# Patient Record
Sex: Male | Born: 1955 | Race: Black or African American | Hispanic: No | Marital: Married | Smoking: Never smoker
Health system: Southern US, Community
[De-identification: ages and names within clinical notes are randomized; demographics above are authoritative.]

## PROBLEM LIST (undated history)

## (undated) DIAGNOSIS — E119 Type 2 diabetes mellitus without complications: Secondary | ICD-10-CM

## (undated) DIAGNOSIS — I10 Essential (primary) hypertension: Secondary | ICD-10-CM

## (undated) HISTORY — PX: HERNIA REPAIR: SHX51

---

## 2008-09-27 ENCOUNTER — Emergency Department (HOSPITAL_COMMUNITY): Admission: EM | Admit: 2008-09-27 | Discharge: 2008-09-27 | Payer: Self-pay | Admitting: Emergency Medicine

## 2010-11-03 IMAGING — CR DG CHEST 2V
2 series · 2 of 2 positions shown · non-contrast
Comparison: None

CLINICAL DATA: Cough

CHEST - 2 VIEW

[view not recorded (1 of 2)]
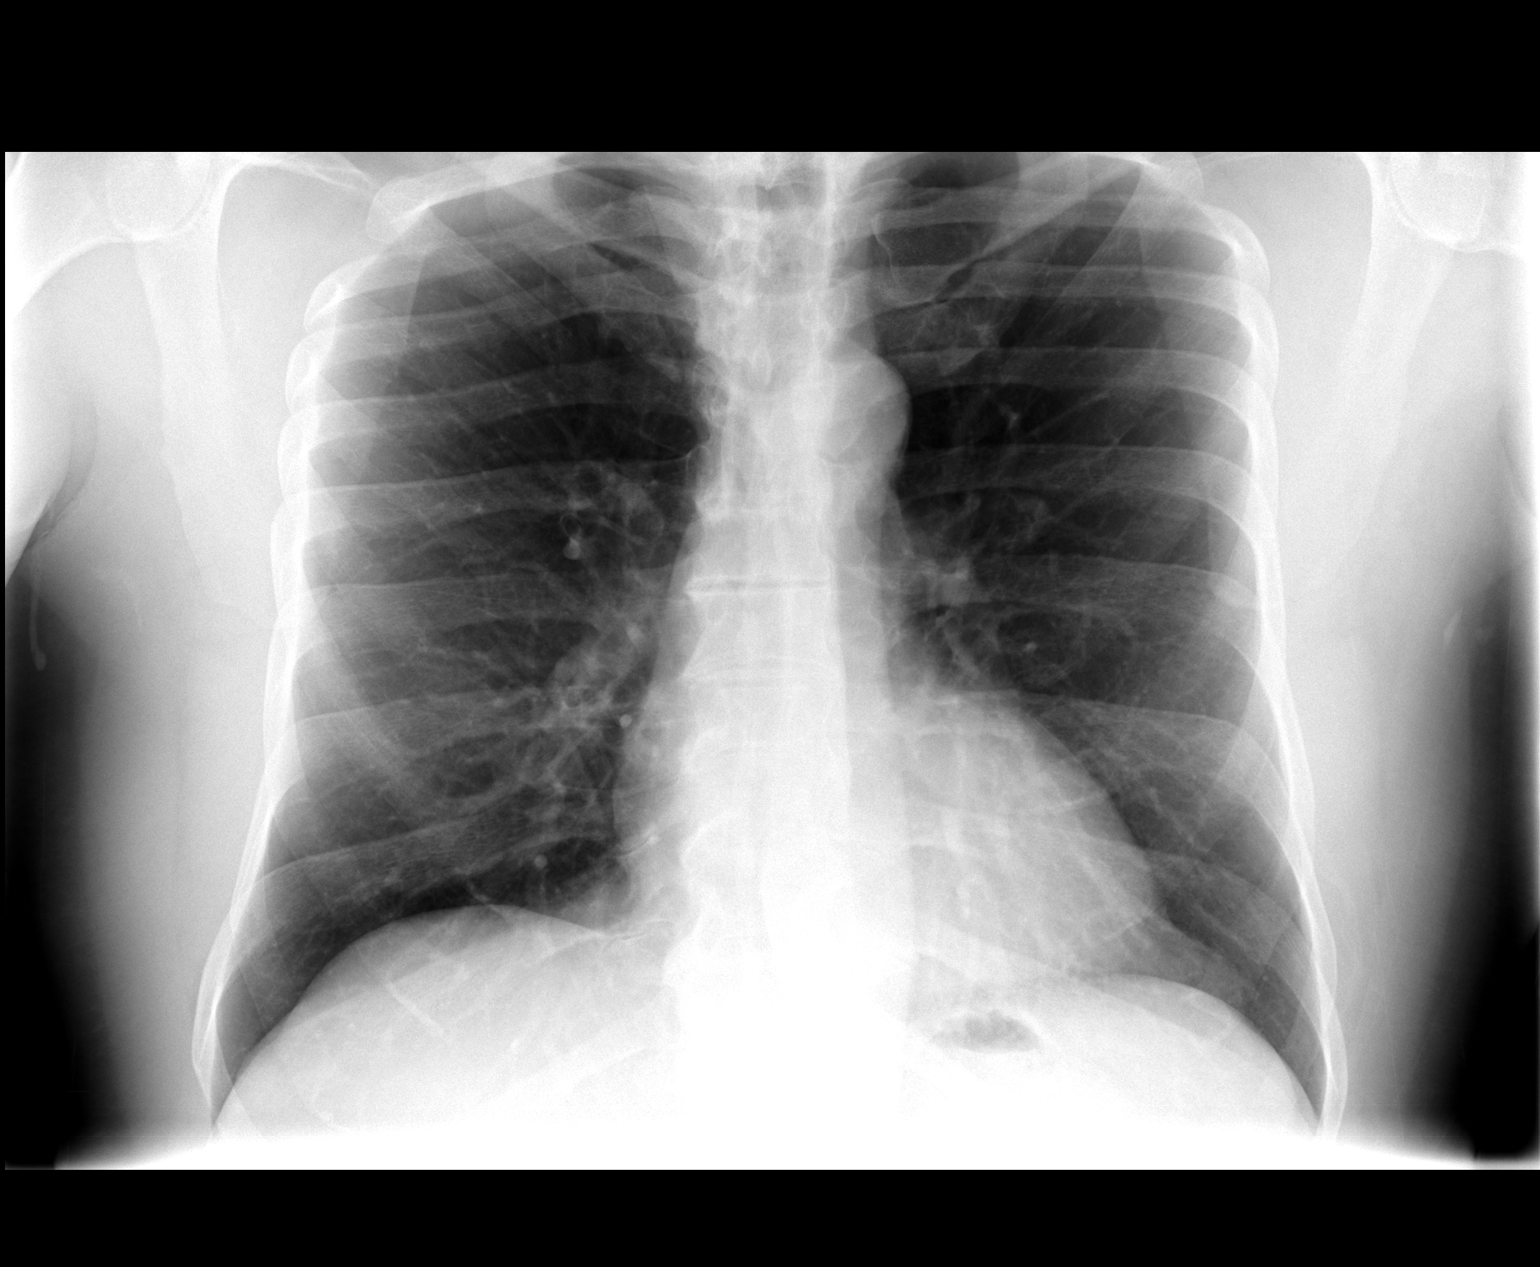

[view not recorded (2 of 2)]
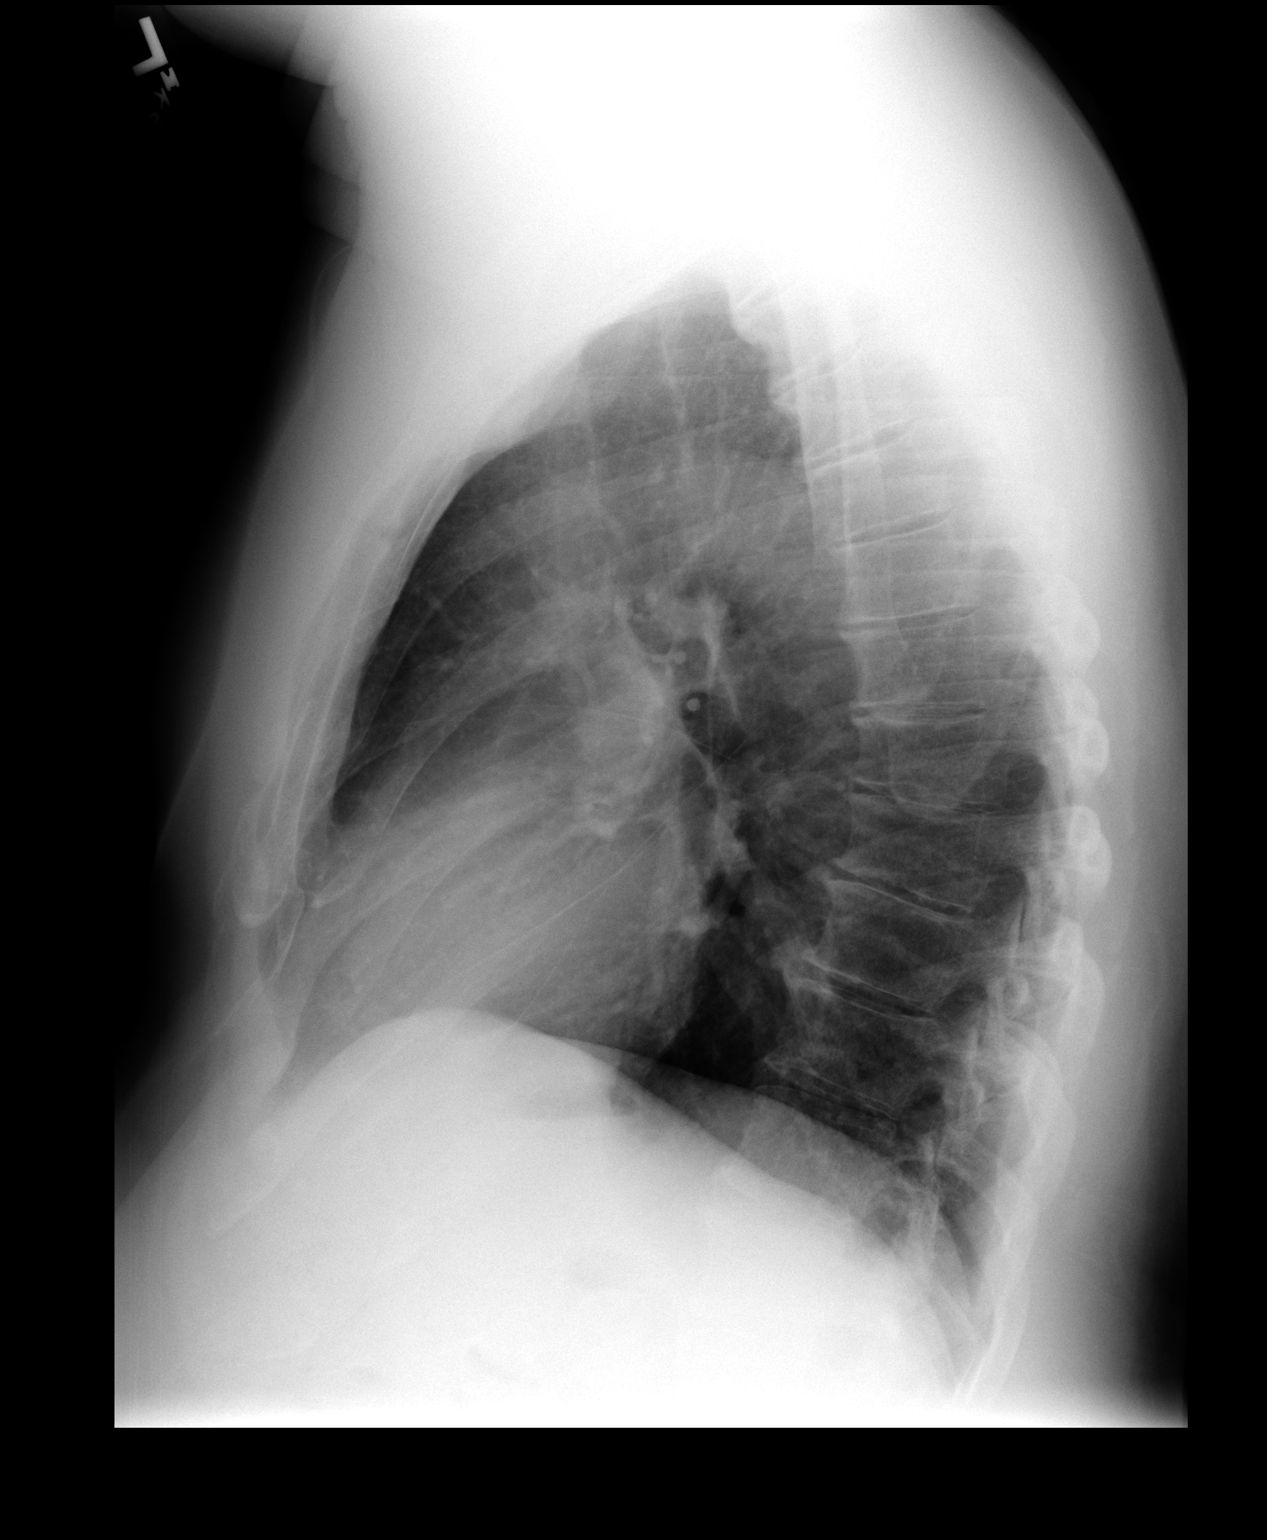

[2 of 2 positions shown; findings below may reference images not displayed]

FINDINGS: The lungs are clear.  Heart and mediastinal contours are
normal. Mid thoracic spine degenerative changes.
IMPRESSION: No acute cardiopulmonary process.

## 2017-09-03 ENCOUNTER — Emergency Department
Admission: EM | Admit: 2017-09-03 | Discharge: 2017-09-03 | Disposition: A | Discharge status: Home / Self Care | Payer: Self-pay | Attending: Emergency Medicine | Admitting: Emergency Medicine

## 2017-09-03 ENCOUNTER — Emergency Department: Payer: Self-pay

## 2017-09-03 ENCOUNTER — Encounter: Payer: Self-pay | Admitting: Emergency Medicine

## 2017-09-03 DIAGNOSIS — J181 Lobar pneumonia, unspecified organism: Secondary | ICD-10-CM | POA: Insufficient documentation

## 2017-09-03 DIAGNOSIS — R070 Pain in throat: Secondary | ICD-10-CM | POA: Insufficient documentation

## 2017-09-03 DIAGNOSIS — J189 Pneumonia, unspecified organism: Secondary | ICD-10-CM

## 2017-09-03 LAB — GROUP A STREP BY PCR: Group A Strep by PCR: NOT DETECTED

## 2017-09-03 MED ORDER — ALBUTEROL SULFATE HFA 108 (90 BASE) MCG/ACT IN AERS: 2.0000 | INHALATION_SPRAY | RESPIRATORY_TRACT | 1 refills | Status: DC | PRN

## 2017-09-03 MED ORDER — PREDNISONE 20 MG PO TABS
60.0000 mg | ORAL_TABLET | Freq: Once | ORAL | Status: AC
  Administered 2017-09-03: 60 mg via ORAL
  Filled 2017-09-03: qty 3

## 2017-09-03 MED ORDER — AZITHROMYCIN 250 MG PO TABS: ORAL_TABLET | ORAL | 0 refills | Status: DC

## 2017-09-03 MED ORDER — AZITHROMYCIN 500 MG PO TABS
500.0000 mg | ORAL_TABLET | Freq: Once | ORAL | Status: AC
  Administered 2017-09-03: 500 mg via ORAL
  Filled 2017-09-03: qty 1

## 2017-09-03 MED ORDER — PREDNISONE 10 MG PO TABS: 50.0000 mg | ORAL_TABLET | Freq: Every day | ORAL | 0 refills | Status: DC

## 2017-09-03 MED ORDER — GUAIFENESIN-CODEINE 100-10 MG/5ML PO SOLN: 10.0000 mL | Freq: Three times a day (TID) | ORAL | 0 refills | Status: DC | PRN

## 2017-09-03 NOTE — ED Provider Notes (Signed)
[facility] Emergency Department Provider Note  ____________________________________________  Time seen: Approximately 8:01 PM  I have reviewed the triage vital signs and the nursing notes.   HISTORY  Chief Complaint Cough and Sore Throat   HPI Roberto Cole is a 62 y.o. male who presents to the emergency department for treatment and evaluation of cough and sore throat for the past couple of weeks.  He has taken Zyrtec with no relief.  He denies history of COPD or asthma.  He denies fever, but admits to chills. No vomiting or diarrhea.  History reviewed. No pertinent past medical history.  There are no active problems to display for this patient.   Past Surgical History:  Procedure Laterality Date  . HERNIA REPAIR      Prior to Admission medications   Medication Sig Start Date End Date Taking? Authorizing Provider  albuterol (PROVENTIL HFA;VENTOLIN HFA) 108 (90 Base) MCG/ACT inhaler Inhale 2 puffs into the lungs every 4 (four) hours as needed for wheezing or shortness of breath. 09/03/17   Desia Saban B, FNP  azithromycin (ZITHROMAX) 250 MG tablet 2 tablets today, then 1 tablet for the next 4 days. 09/03/17   Kordelia Severin B, FNP  guaiFENesin-codeine 100-10 MG/5ML syrup Take 10 mLs by mouth 3 (three) times daily as needed. 09/03/17   Dona Walby B, FNP  predniSONE (DELTASONE) 10 MG tablet Take 5 tablets (50 mg total) by mouth daily. 09/03/17   Chinita Pester, FNP    Allergies Patient has no known allergies.  No family history on file.  Social History Social History   Tobacco Use  . Smoking status: Never Smoker  . Smokeless tobacco: Never Used  Substance Use Topics  . Alcohol use: Never    Frequency: Never  . Drug use: Never    Review of Systems Constitutional: Positive for chills ENT: Positive for sore throat. Cardiovascular: Denies chest pain. Respiratory: Negative for shortness of breath.  Positive for cough. Gastrointestinal:  Negative for nausea, no vomiting.  No diarrhea.  Musculoskeletal: Negative for body aches Skin: Negative for rash. Neurological: Negative for headaches ____________________________________________   PHYSICAL EXAM:  VITAL SIGNS: ED Triage Vitals [09/03/17 1928]  Enc Vitals Group     BP (!) 128/94     Pulse Rate 99     Resp 18     Temp 98.5 F (36.9 C)     Temp Source Oral     SpO2 94 %     Weight      Height      Head Circumference      Peak Flow      Pain Score 9     Pain Loc      Pain Edu?      Excl. in GC?     Constitutional: Alert and oriented.  Well appearing and in no acute distress. Eyes: Conjunctivae are normal. EOMI. Ears: Bilateral tympanic membranes are normal Nose: No sinus congestion noted; no rhinnorhea. Mouth/Throat: Mucous membranes are moist.  Oropharynx mildly erythematous. Tonsils not visualized. Neck: No stridor.  Lymphatic: No cervical lymphadenopathy. Cardiovascular: Normal rate, regular rhythm. Good peripheral circulation. Respiratory: Normal respiratory effort.  No retractions.  Breath sounds clear to auscultation throughout. Gastrointestinal: Soft and nontender.  Musculoskeletal: FROM x 4 extremities.  Neurologic:  Normal speech and language.  Skin:  Skin is warm, dry and intact. No rash noted. Psychiatric: Mood and affect are normal. Speech and behavior are normal.  ____________________________________________   LABS (all labs ordered  are listed, but only abnormal results are displayed)  Labs Reviewed  GROUP A STREP BY PCR   ____________________________________________  EKG  Not indicated ____________________________________________  RADIOLOGY  Chest x-ray shows concern for newly diagnosed pulmonary nodules and CT is recommended by radiology.  CT of the chest without contrast shows small foci of bilateral upper lobe pneumonia that correspondence to the opacity/pulmonary nodules on the plain chest  film. ____________________________________________   PROCEDURES  Procedure(s) performed: None  Critical Care performed: No ____________________________________________   INITIAL IMPRESSION / ASSESSMENT AND PLAN / ED COURSE  62 y.o. male who presents to the emergency department for treatment and evaluation of sore throat and cough for the past 2 weeks.  Patient states that he has taken Zyrtec without any relief at all.  He states that the symptoms are worsening.  Chest x-ray has been requested to confirm that there is no pneumonia.  After CT, patient was started on azithromycin and given prescription for the same.  He will also be given prednisone, albuterol, and guaifenesin with codeine.  He was advised to see his primary care doctor in 1 week for recheck.  He was encouraged to have a follow-up chest x-ray in 4 to 6 weeks to make sure that the pneumonia has cleared and that there is no underlying malignancies.  He was encouraged to return to the ER for symptoms that change or worsen if unable to schedule an appointment.  Medications  azithromycin (ZITHROMAX) tablet 500 mg (500 mg Oral Given 09/03/17 2234)  predniSONE (DELTASONE) tablet 60 mg (60 mg Oral Given 09/03/17 2234)    ED Discharge Orders        Ordered    azithromycin (ZITHROMAX) 250 MG tablet     09/03/17 2244    predniSONE (DELTASONE) 10 MG tablet  Daily     09/03/17 2244    guaiFENesin-codeine 100-10 MG/5ML syrup  3 times daily PRN     09/03/17 2244    albuterol (PROVENTIL HFA;VENTOLIN HFA) 108 (90 Base) MCG/ACT inhaler  Every 4 hours PRN     09/03/17 2244       Pertinent labs & imaging results that were available during my care of the patient were reviewed by me and considered in my medical decision making (see chart for details).    If controlled substance prescribed during this visit, 12 month history viewed on the [facility] prior to issuing an initial prescription for Schedule II or III  opiod. ____________________________________________   FINAL CLINICAL IMPRESSION(S) / ED DIAGNOSES  Final diagnoses:  Community acquired pneumonia of left upper lobe of lung (HCC)  Community acquired pneumonia of right upper lobe of lung (HCC)    Note:  This document was prepared using Conservation officer, historic buildings and may include unintentional dictation errors.     Chinita Pester, FNP 09/03/17 2321    Dionne Bucy, MD 09/03/17 2350

## 2017-09-03 NOTE — ED Triage Notes (Signed)
Pt states he has had a cough and sore throat for a couple of weeks and it taking Zyrtec with no relief.  He states he has yellow phlegm coming up and that he has been drinking lot of water.  Pt denies any COPD or Asthma hx.

## 2019-10-10 IMAGING — CT CT CHEST W/O CM
2 of 3 series · 15 of 36 positions shown, 18 images · non-contrast
Comparison: 09/03/2017 chest radiograph.

CLINICAL DATA: 62 y/o M; productive cough and sore throat for a
couple weeks.

EXAM:
CT CHEST WITHOUT CONTRAST
TECHNIQUE: Multidetector CT imaging of the chest was performed following the
standard protocol without IV contrast.

[Series 2: thorax · axial · 0.70mm/px · z∈[-546,-270]mm · 12 of 163 slices shown, 15 images]
[im 13/163  mediastinal]
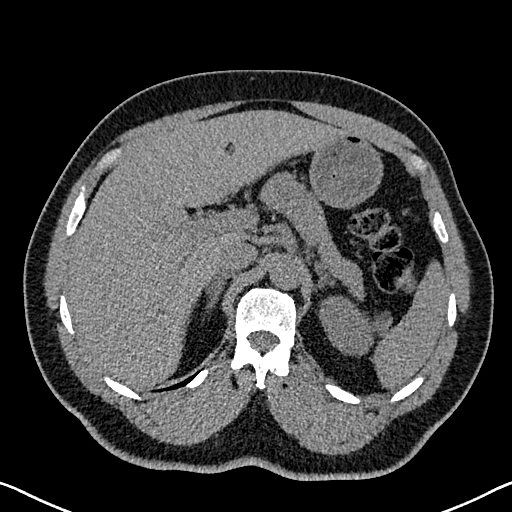
[im 13/163  lung]
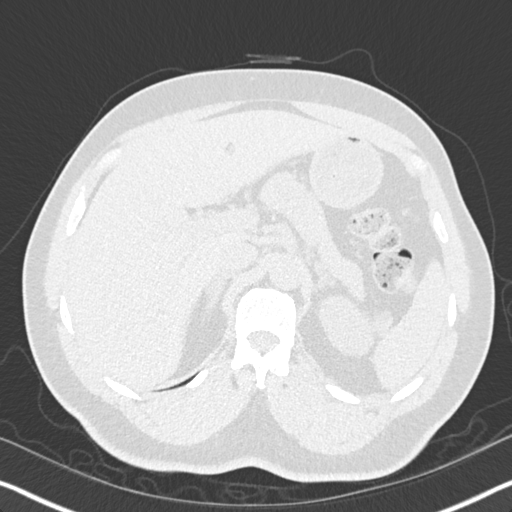
[im 25/163  lung]
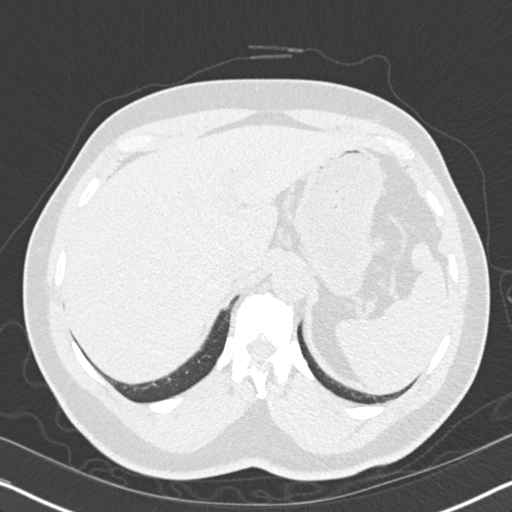
[im 37/163  lung]
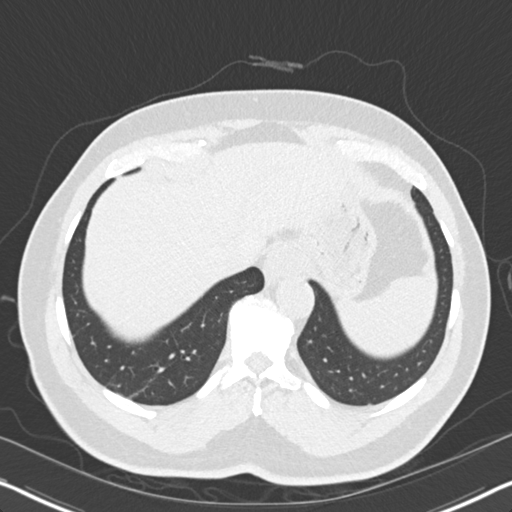
[im 49/163  lung]
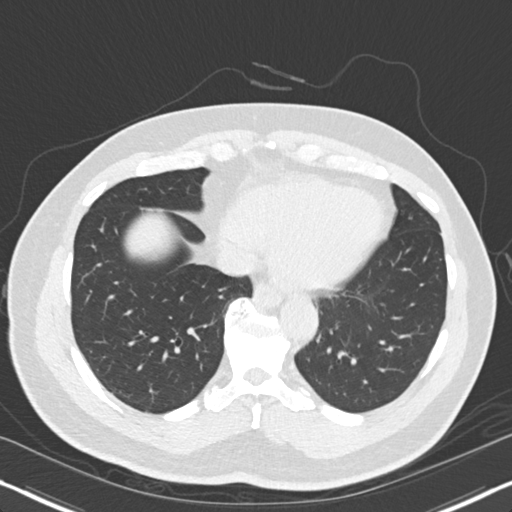
[im 61/163  mediastinal]
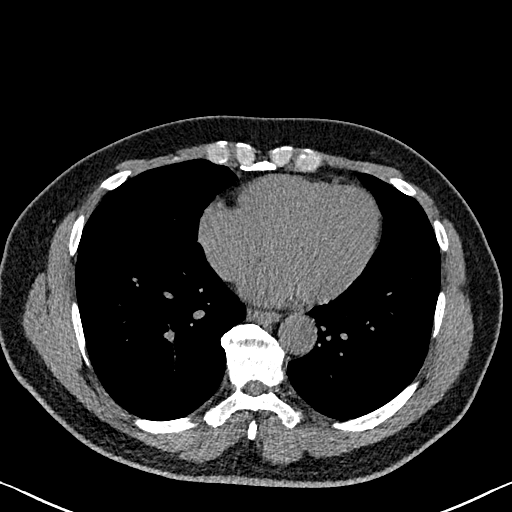
[im 61/163  lung]
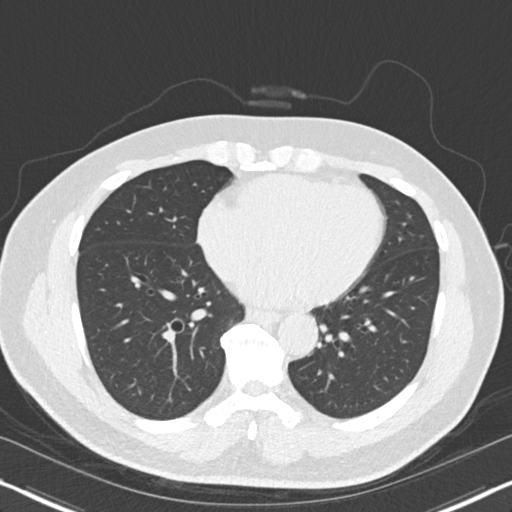
[im 73/163  lung]
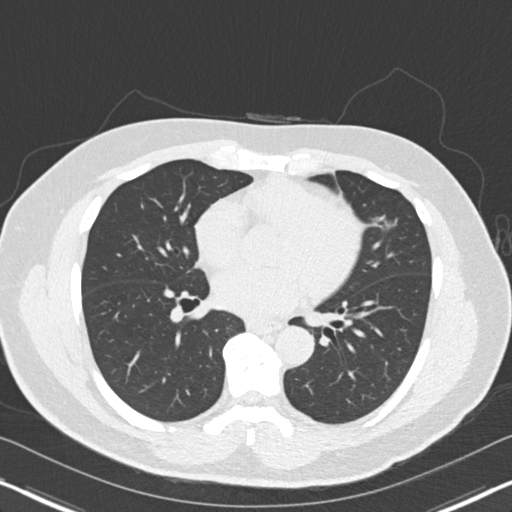
[im 91/163  lung]
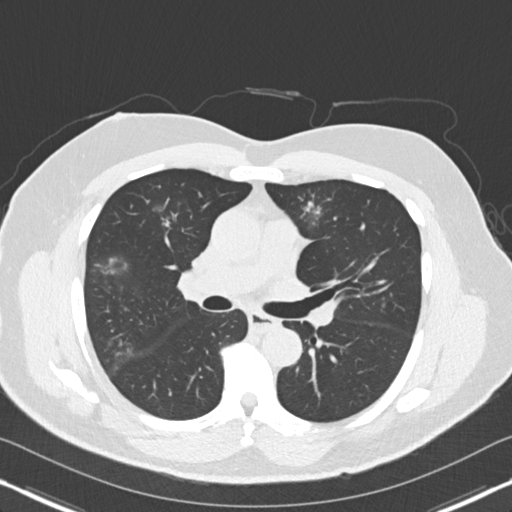
[im 103/163  lung]
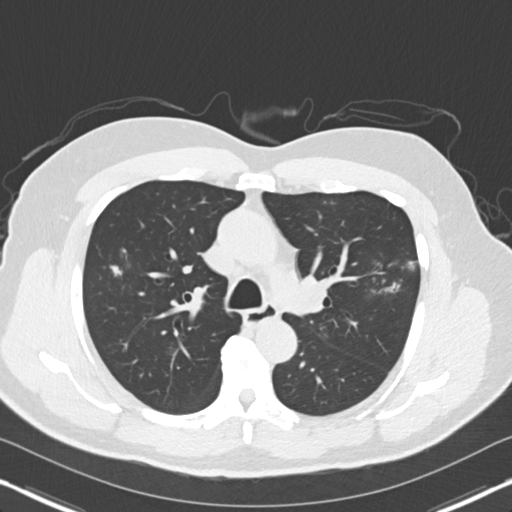
[im 115/163  mediastinal]
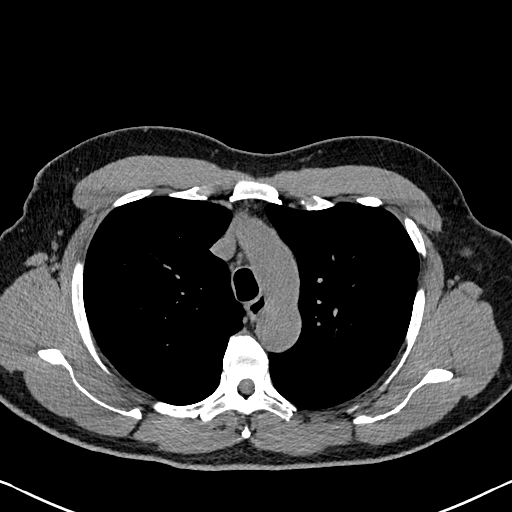
[im 115/163  lung]
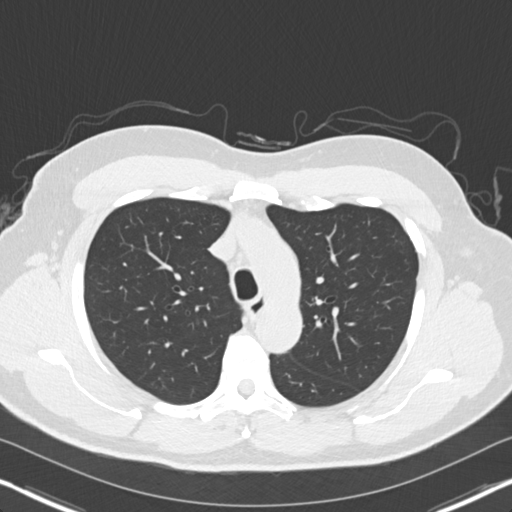
[im 127/163  lung]
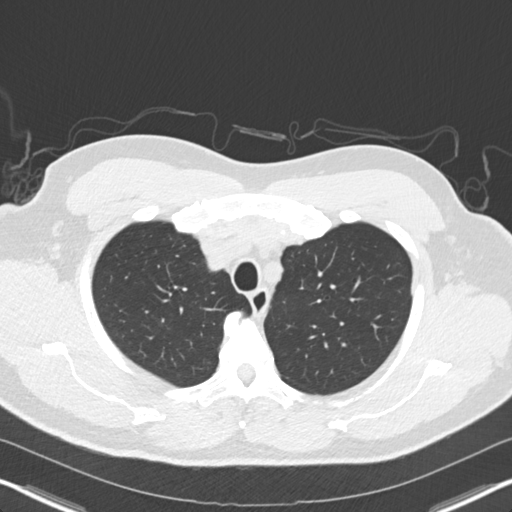
[im 139/163  lung]
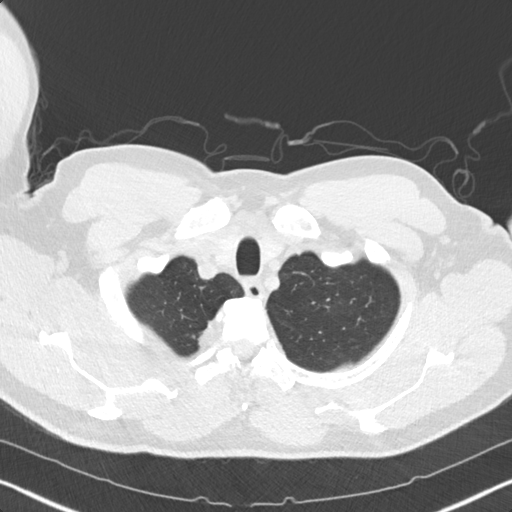
[im 151/163  lung]
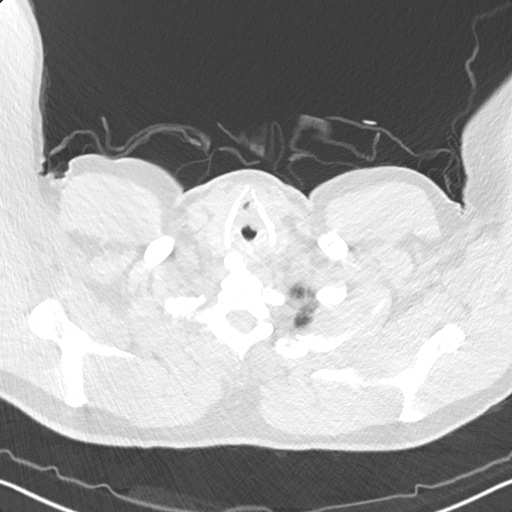

[Series 5: coronal · coronal · 0.68mm/px · 3 of 135 slices shown]
[im 27/135  lung]
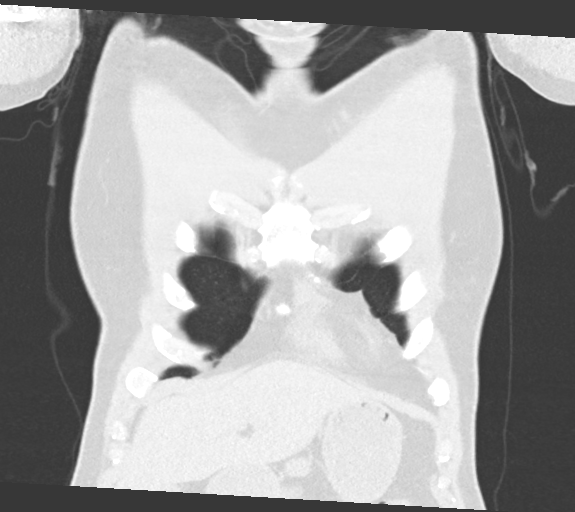
[im 54/135  lung]
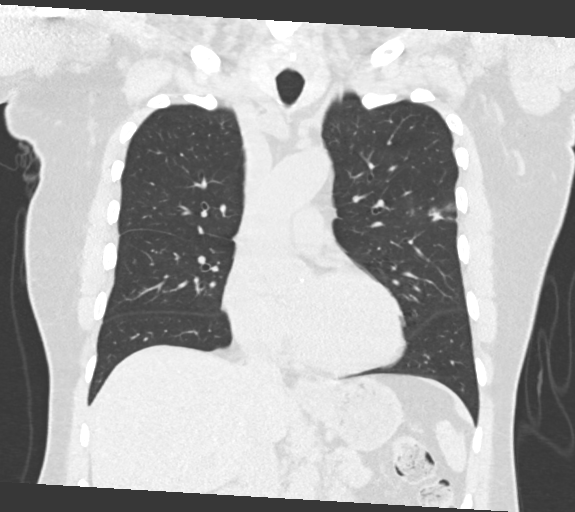
[im 81/135  lung]
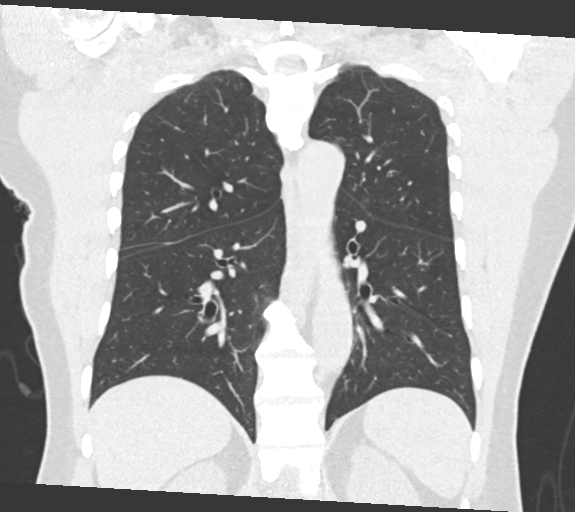

[15 of 36 positions shown; findings below may reference images not displayed]

FINDINGS: Cardiovascular: No significant vascular findings. Normal heart size.
No pericardial effusion.

Mediastinum/Nodes: No enlarged mediastinal or axillary lymph nodes.
Thyroid gland, trachea, and esophagus demonstrate no significant
findings.

Lungs/Pleura: Small patchy consolidations and clustered ground-glass
nodules in bronchovascular distribution within the upper lobes
bilaterally corresponding to nodular opacities on the chest
radiograph. No pleural effusion or pneumothorax.

Upper Abdomen: No acute abnormality.

Musculoskeletal: No chest wall mass or suspicious bone lesions
identified.
IMPRESSION: Small foci of bilateral upper lobe pneumonia corresponding to
opacities on chest radiograph. Followup PA and lateral chest X-ray
is recommended in 3-4 weeks following trial of antibiotic therapy to
ensure resolution and exclude underlying malignancy.

By: Lote Batalla M.D.

## 2024-05-26 ENCOUNTER — Emergency Department

## 2024-05-26 ENCOUNTER — Other Ambulatory Visit: Payer: Self-pay

## 2024-05-26 ENCOUNTER — Observation Stay
Admission: EM | Admit: 2024-05-26 | Discharge: 2024-05-28 | Disposition: A | Attending: Internal Medicine | Admitting: Internal Medicine

## 2024-05-26 DIAGNOSIS — N189 Chronic kidney disease, unspecified: Secondary | ICD-10-CM | POA: Insufficient documentation

## 2024-05-26 DIAGNOSIS — A046 Enteritis due to Yersinia enterocolitica: Principal | ICD-10-CM | POA: Insufficient documentation

## 2024-05-26 DIAGNOSIS — Z79899 Other long term (current) drug therapy: Secondary | ICD-10-CM | POA: Insufficient documentation

## 2024-05-26 DIAGNOSIS — E785 Hyperlipidemia, unspecified: Secondary | ICD-10-CM | POA: Insufficient documentation

## 2024-05-26 DIAGNOSIS — R6883 Chills (without fever): Secondary | ICD-10-CM

## 2024-05-26 DIAGNOSIS — E1122 Type 2 diabetes mellitus with diabetic chronic kidney disease: Secondary | ICD-10-CM | POA: Insufficient documentation

## 2024-05-26 DIAGNOSIS — I129 Hypertensive chronic kidney disease with stage 1 through stage 4 chronic kidney disease, or unspecified chronic kidney disease: Secondary | ICD-10-CM | POA: Insufficient documentation

## 2024-05-26 DIAGNOSIS — A058 Other specified bacterial foodborne intoxications: Secondary | ICD-10-CM

## 2024-05-26 DIAGNOSIS — E872 Acidosis, unspecified: Secondary | ICD-10-CM

## 2024-05-26 DIAGNOSIS — R109 Unspecified abdominal pain: Secondary | ICD-10-CM | POA: Diagnosis present

## 2024-05-26 DIAGNOSIS — I5A Non-ischemic myocardial injury (non-traumatic): Secondary | ICD-10-CM | POA: Insufficient documentation

## 2024-05-26 DIAGNOSIS — R531 Weakness: Secondary | ICD-10-CM

## 2024-05-26 DIAGNOSIS — I10 Essential (primary) hypertension: Secondary | ICD-10-CM | POA: Insufficient documentation

## 2024-05-26 DIAGNOSIS — K429 Umbilical hernia without obstruction or gangrene: Secondary | ICD-10-CM | POA: Insufficient documentation

## 2024-05-26 DIAGNOSIS — A419 Sepsis, unspecified organism: Principal | ICD-10-CM | POA: Insufficient documentation

## 2024-05-26 DIAGNOSIS — R7989 Other specified abnormal findings of blood chemistry: Secondary | ICD-10-CM | POA: Insufficient documentation

## 2024-05-26 DIAGNOSIS — K5730 Diverticulosis of large intestine without perforation or abscess without bleeding: Secondary | ICD-10-CM | POA: Insufficient documentation

## 2024-05-26 DIAGNOSIS — N2889 Other specified disorders of kidney and ureter: Secondary | ICD-10-CM | POA: Insufficient documentation

## 2024-05-26 HISTORY — DX: Essential (primary) hypertension: I10

## 2024-05-26 HISTORY — DX: Type 2 diabetes mellitus without complications: E11.9

## 2024-05-26 LAB — COMPREHENSIVE METABOLIC PANEL WITH GFR
ALT: 11 U/L (ref 0–44)
AST: 16 U/L (ref 15–41)
Albumin: 4.2 g/dL (ref 3.5–5.0)
Alkaline Phosphatase: 129 U/L — ABNORMAL HIGH (ref 38–126)
Anion gap: 15 (ref 5–15)
BUN: 21 mg/dL (ref 8–23)
CO2: 22 mmol/L (ref 22–32)
Calcium: 9.3 mg/dL (ref 8.9–10.3)
Chloride: 100 mmol/L (ref 98–111)
Creatinine, Ser: 1.87 mg/dL — ABNORMAL HIGH (ref 0.61–1.24)
GFR, Estimated: 39 mL/min — ABNORMAL LOW
Glucose, Bld: 173 mg/dL — ABNORMAL HIGH (ref 70–99)
Potassium: 4.9 mmol/L (ref 3.5–5.1)
Sodium: 137 mmol/L (ref 135–145)
Total Bilirubin: 1.7 mg/dL — ABNORMAL HIGH (ref 0.0–1.2)
Total Protein: 7.1 g/dL (ref 6.5–8.1)

## 2024-05-26 LAB — URINALYSIS, ROUTINE W REFLEX MICROSCOPIC
Bilirubin Urine: NEGATIVE
Glucose, UA: NEGATIVE mg/dL
Hgb urine dipstick: NEGATIVE
Ketones, ur: NEGATIVE mg/dL
Leukocytes,Ua: NEGATIVE
Nitrite: NEGATIVE
Protein, ur: NEGATIVE mg/dL
Specific Gravity, Urine: 1.008 (ref 1.005–1.030)
pH: 5 (ref 5.0–8.0)

## 2024-05-26 LAB — CBC
HCT: 36.3 % — ABNORMAL LOW (ref 39.0–52.0)
Hemoglobin: 12.3 g/dL — ABNORMAL LOW (ref 13.0–17.0)
MCH: 28.6 pg (ref 26.0–34.0)
MCHC: 33.9 g/dL (ref 30.0–36.0)
MCV: 84.4 fL (ref 80.0–100.0)
Platelets: 213 10*3/uL (ref 150–400)
RBC: 4.3 MIL/uL (ref 4.22–5.81)
RDW: 13.2 % (ref 11.5–15.5)
WBC: 17.2 10*3/uL — ABNORMAL HIGH (ref 4.0–10.5)
nRBC: 0 % (ref 0.0–0.2)

## 2024-05-26 LAB — LACTIC ACID, PLASMA: Lactic Acid, Venous: 2.4 mmol/L (ref 0.5–1.9)

## 2024-05-26 LAB — RESP PANEL BY RT-PCR (RSV, FLU A&B, COVID)  RVPGX2
Influenza A by PCR: NEGATIVE
Influenza B by PCR: NEGATIVE
Resp Syncytial Virus by PCR: NEGATIVE
SARS Coronavirus 2 by RT PCR: NEGATIVE

## 2024-05-26 LAB — PROCALCITONIN: Procalcitonin: 0.92 ng/mL

## 2024-05-26 LAB — TROPONIN T, HIGH SENSITIVITY
Troponin T High Sensitivity: 24 ng/L — ABNORMAL HIGH (ref 0–19)
Troponin T High Sensitivity: 22 ng/L — ABNORMAL HIGH (ref 0–19)

## 2024-05-26 LAB — CBG MONITORING, ED: Glucose-Capillary: 153 mg/dL — ABNORMAL HIGH (ref 70–99)

## 2024-05-26 MED ORDER — VANCOMYCIN HCL 2000 MG/400ML IV SOLN
2000.0000 mg | Freq: Once | INTRAVENOUS | Status: AC
  Administered 2024-05-27: 2000 mg via INTRAVENOUS
  Filled 2024-05-26: qty 400

## 2024-05-26 MED ORDER — IOHEXOL 350 MG/ML SOLN
75.0000 mL | Freq: Once | INTRAVENOUS | Status: AC | PRN
  Administered 2024-05-26: 75 mL via INTRAVENOUS

## 2024-05-26 MED ORDER — ONDANSETRON HCL 4 MG/2ML IJ SOLN
4.0000 mg | Freq: Once | INTRAMUSCULAR | Status: AC
  Administered 2024-05-26: 4 mg via INTRAVENOUS
  Filled 2024-05-26: qty 2

## 2024-05-26 MED ORDER — LACTATED RINGERS IV BOLUS
1000.0000 mL | Freq: Once | INTRAVENOUS | Status: AC
  Administered 2024-05-26: 1000 mL via INTRAVENOUS

## 2024-05-26 MED ORDER — PIPERACILLIN-TAZOBACTAM 3.375 G IVPB 30 MIN
3.3750 g | Freq: Once | INTRAVENOUS | Status: AC
  Administered 2024-05-26: 3.375 g via INTRAVENOUS
  Filled 2024-05-26: qty 50

## 2024-05-26 NOTE — ED Triage Notes (Signed)
 Pt to ED for nausea and weakness. Pt reports nausea began yesterday along with weakness. Wife reports pt has been confused and fatigued starting today. Pt reports feeling weak in both legs. Denies Cp, SOB, Abdominal pain. Pt has hx of diabetes. Pt states he does not check glucose daily.

## 2024-05-26 NOTE — ED Provider Notes (Signed)
 "  [facility] Provider Note    Event Date/Time   First MD Initiated Contact with Patient 05/26/24 1944     (approximate)   History   Nausea and Weakness   HPI  Roberto Cole is a 69 y.o. male who presents to the ED for evaluation of Nausea and Weakness   I reviewed a neurology clinic visit from 2023, nothing more recent.  History of TIA, HTN, HLD, DM  Patient presents with 1 day of nausea and weakness without emesis or pain.  Reports feeling confused and acknowledging this and has no other symptoms.  No stool or urinary changes.  Known right inguinal hernia and denies any pain to this area.  Reports a normal bowel movement this morning   Physical Exam   Triage Vital Signs: ED Triage Vitals  Encounter Vitals Group     BP 05/26/24 1722 107/65     Girls Systolic BP Percentile --      Girls Diastolic BP Percentile --      Boys Systolic BP Percentile --      Boys Diastolic BP Percentile --      Pulse Rate 05/26/24 1722 83     Resp 05/26/24 1722 18     Temp 05/26/24 1722 99.6 F (37.6 C)     Temp Source 05/26/24 1722 Oral     SpO2 05/26/24 1722 95 %     Weight 05/26/24 1722 180 lb (81.6 kg)     Height 05/26/24 1722 5' 9 (1.753 m)     Head Circumference --      Peak Flow --      Pain Score 05/26/24 1730 0     Pain Loc --      Pain Education --      Exclude from Growth Chart --     Most recent vital signs: Vitals:   05/26/24 1731 05/26/24 2033  BP:  123/74  Pulse:  77  Resp: (!) 24 (!) 26  Temp:  98.3 F (36.8 C)  SpO2:  98%    General: Awake, no distress.  CV:  Good peripheral perfusion.  Resp:  Normal effort.  Abd:  No distention.  Soft right inguinal hernia MSK:  No deformity noted.  Neuro:  No focal deficits appreciated. Other:     ED Results / Procedures / Treatments   Labs (all labs ordered are listed, but only abnormal results are displayed) Labs Reviewed  COMPREHENSIVE METABOLIC PANEL WITH GFR - Abnormal; Notable for  the following components:      Result Value   Glucose, Bld 173 (*)    Creatinine, Ser 1.87 (*)    Alkaline Phosphatase 129 (*)    Total Bilirubin 1.7 (*)    GFR, Estimated 39 (*)    All other components within normal limits  CBC - Abnormal; Notable for the following components:   WBC 17.2 (*)    Hemoglobin 12.3 (*)    HCT 36.3 (*)    All other components within normal limits  LACTIC ACID, PLASMA - Abnormal; Notable for the following components:   Lactic Acid, Venous 2.4 (*)    All other components within normal limits  CBG MONITORING, ED - Abnormal; Notable for the following components:   Glucose-Capillary 153 (*)    All other components within normal limits  TROPONIN T, HIGH SENSITIVITY - Abnormal; Notable for the following components:   Troponin T High Sensitivity 24 (*)    All other components within normal limits  TROPONIN T, HIGH SENSITIVITY - Abnormal; Notable for the following components:   Troponin T High Sensitivity 22 (*)    All other components within normal limits  RESP PANEL BY RT-PCR (RSV, FLU A&B, COVID)  RVPGX2  CULTURE, BLOOD (ROUTINE X 2)  CULTURE, BLOOD (ROUTINE X 2)  PROCALCITONIN  URINALYSIS, ROUTINE W REFLEX MICROSCOPIC  CBG MONITORING, ED    EKG Sinus rhythm the rate of 84 bpm, normal axis and intervals.  Inverted T waves to V5 and V6, nonspecific biphasic T waves to 2 and 3.  No STEMI.  No comparison EKG.  RADIOLOGY CXR interpreted by me without evidence of acute cardiopulmonary pathology. CT abdomen/pelvis interpreted by me with multiple intrarenal calculi clustered within the superior calyx.  No ureteral obstruction  Official radiology report(s): CT ABDOMEN PELVIS W CONTRAST Result Date: 05/26/2024 EXAM: CT ABDOMEN AND PELVIS WITH CONTRAST 05/26/2024 09:24:25 PM TECHNIQUE: CT of the abdomen and pelvis was performed with the administration of 75 mL of iohexol  (OMNIPAQUE ) 350 MG/ML injection. Multiplanar reformatted images are provided for review.  Automated exposure control, iterative reconstruction, and/or weight-based adjustment of the mA/kV was utilized to reduce the radiation dose to as low as reasonably achievable. COMPARISON: None available. CLINICAL HISTORY: Sepsis, nausea, lower abdominal tenderness. FINDINGS: LOWER CHEST: There is atelectasis in the lung bases. LIVER: The liver is unremarkable. GALLBLADDER AND BILE DUCTS: Gallbladder is unremarkable. No biliary ductal dilatation. SPLEEN: No acute abnormality. PANCREAS: No acute abnormality. ADRENAL GLANDS: No acute abnormality. KIDNEYS, URETERS AND BLADDER: Right kidney: Multiple right renal calculi are present, the majority of which are clustered together in the central kidney. These all measure 4 mm. These likely cause obstruction of the superior pole calyx. There is a cyst in the central pole of the right kidney measuring 2.7 cm. Per consensus, no follow-up is needed for simple Bosniak type 1 and 2 renal cysts, unless the patient has a malignancy history or risk factors. Left kidney: Left kidney appears normal. General: No hydronephrosis. No perinephric or periureteral stranding. Bladder: Urinary bladder is unremarkable. Prostate: Prostate gland is mildly enlarged. GI AND BOWEL: There is a small hiatal hernia. Sigmoid and descending colon diverticulosis. The appendix appears normal. There is no bowel obstruction. PERITONEUM AND RETROPERITONEUM: There is a small amount of free fluid in the pelvis. No free air. VASCULATURE: Aorta is normal in caliber. LYMPH NODES: No lymphadenopathy. REPRODUCTIVE ORGANS: Prostate gland is mildly enlarged. BONES AND SOFT TISSUES: Moderate left and large right inguinal hernias are present containing nondilated small bowel. There is a small fat containing umbilical hernia. No acute osseous abnormality. IMPRESSION: 1. Multiple right renal calculi measuring up to 4 mm, clustered in the central kidney, likely obstructing the superior pole calyx. 2. Right renal cyst  measuring 2.7 cm, without follow-up imaging recommended. 3. Moderate left and large right inguinal hernias containing nondilated small bowel. 4. Sigmoid and descending colon diverticulosis without diverticulitis. 5. Small volume free fluid in the pelvis. Electronically signed by: Greig Pique MD 05/26/2024 10:51 PM EST RP Workstation: [id number]   DG Chest Portable 1 View Result Date: 05/26/2024 CLINICAL DATA:  Sepsis, nausea, weakness EXAM: PORTABLE CHEST 1 VIEW COMPARISON:  09/03/2017 FINDINGS: Single frontal view of the chest demonstrates an unremarkable cardiac silhouette. No acute airspace disease, effusion, or pneumothorax. No acute bony abnormalities. IMPRESSION: 1. No acute intrathoracic process. Electronically Signed   By: Ozell Daring M.D.   On: 05/26/2024 22:15    PROCEDURES and INTERVENTIONS:  .Critical Care  Performed by: Claudene Rover, MD  Authorized by: Claudene Rover, MD   Critical care provider statement:    Critical care time (minutes):  30   Critical care time was exclusive of:  Separately billable procedures and treating other patients   Critical care was necessary to treat or prevent imminent or life-threatening deterioration of the following conditions:  Sepsis   Critical care was time spent personally by me on the following activities:  Development of treatment plan with patient or surrogate, discussions with consultants, evaluation of patient's response to treatment, examination of patient, ordering and review of laboratory studies, ordering and review of radiographic studies, ordering and performing treatments and interventions, pulse oximetry, re-evaluation of patient's condition and review of old charts   Medications  piperacillin -tazobactam (ZOSYN ) IVPB 3.375 g (3.375 g Intravenous New Bag/Given 05/26/24 2330)  vancomycin  (VANCOREADY) IVPB 2000 mg/400 mL (has no administration in time range)  lactated ringers  bolus 1,000 mL (1,000 mLs Intravenous New Bag/Given 05/26/24  2024)  ondansetron  (ZOFRAN ) injection 4 mg (4 mg Intravenous Given 05/26/24 2025)  iohexol  (OMNIPAQUE ) 350 MG/ML injection 75 mL (75 mLs Intravenous Contrast Given 05/26/24 2114)     IMPRESSION / MDM / ASSESSMENT AND PLAN / ED COURSE  I reviewed the triage vital signs and the nursing notes.  Differential diagnosis includes, but is not limited to, viral syndrome, UTI, sepsis, dehydration or AKI, incarcerated hernia, SBO, UTI, pneumonia  {Patient presents with symptoms of an acute illness or injury that is potentially life-threatening.  Patient presents with 1 day of vague generalized weakness and nausea concerning for sepsis.  Borderline temperature, elevated respiratory rate and leukocytosis, lactic acid and procalcitonin.  Elevated renal function without comparison concerning for possible AKI.  CXR is clear and CT abdomen/pelvis without clear etiology, intrarenal stones seem to be obstructing a superior calyx on the right.  Awaiting UA at the time of signout to oncoming physician with anticipation of medical admission for urinary versus unknown source      FINAL CLINICAL IMPRESSION(S) / ED DIAGNOSES   Final diagnoses:  None     Rx / DC Orders   ED Discharge Orders     None        Note:  This document was prepared using Dragon voice recognition software and may include unintentional dictation errors.   Claudene Rover, MD 05/26/24 [id number]  "

## 2024-05-26 NOTE — ED Notes (Signed)
 Patient transported to and from CT.

## 2024-05-27 DIAGNOSIS — R1084 Generalized abdominal pain: Secondary | ICD-10-CM

## 2024-05-27 DIAGNOSIS — R6883 Chills (without fever): Secondary | ICD-10-CM

## 2024-05-27 DIAGNOSIS — R531 Weakness: Secondary | ICD-10-CM

## 2024-05-27 DIAGNOSIS — A419 Sepsis, unspecified organism: Secondary | ICD-10-CM

## 2024-05-27 DIAGNOSIS — E1122 Type 2 diabetes mellitus with diabetic chronic kidney disease: Secondary | ICD-10-CM | POA: Insufficient documentation

## 2024-05-27 DIAGNOSIS — I5A Non-ischemic myocardial injury (non-traumatic): Secondary | ICD-10-CM | POA: Insufficient documentation

## 2024-05-27 DIAGNOSIS — E872 Acidosis, unspecified: Secondary | ICD-10-CM

## 2024-05-27 DIAGNOSIS — R109 Unspecified abdominal pain: Secondary | ICD-10-CM | POA: Diagnosis present

## 2024-05-27 DIAGNOSIS — N1831 Chronic kidney disease, stage 3a: Secondary | ICD-10-CM

## 2024-05-27 DIAGNOSIS — I10 Essential (primary) hypertension: Secondary | ICD-10-CM | POA: Insufficient documentation

## 2024-05-27 LAB — BASIC METABOLIC PANEL WITH GFR
Anion gap: 13 (ref 5–15)
BUN: 23 mg/dL (ref 8–23)
CO2: 24 mmol/L (ref 22–32)
Calcium: 8.5 mg/dL — ABNORMAL LOW (ref 8.9–10.3)
Chloride: 102 mmol/L (ref 98–111)
Creatinine, Ser: 1.72 mg/dL — ABNORMAL HIGH (ref 0.61–1.24)
GFR, Estimated: 43 mL/min — ABNORMAL LOW
Glucose, Bld: 118 mg/dL — ABNORMAL HIGH (ref 70–99)
Potassium: 4.1 mmol/L (ref 3.5–5.1)
Sodium: 139 mmol/L (ref 135–145)

## 2024-05-27 LAB — CBC
HCT: 33.3 % — ABNORMAL LOW (ref 39.0–52.0)
Hemoglobin: 11.3 g/dL — ABNORMAL LOW (ref 13.0–17.0)
MCH: 28.5 pg (ref 26.0–34.0)
MCHC: 33.9 g/dL (ref 30.0–36.0)
MCV: 83.9 fL (ref 80.0–100.0)
Platelets: 174 10*3/uL (ref 150–400)
RBC: 3.97 MIL/uL — ABNORMAL LOW (ref 4.22–5.81)
RDW: 13.2 % (ref 11.5–15.5)
WBC: 12.5 10*3/uL — ABNORMAL HIGH (ref 4.0–10.5)
nRBC: 0 % (ref 0.0–0.2)

## 2024-05-27 LAB — LACTIC ACID, PLASMA
Lactic Acid, Venous: 1.4 mmol/L (ref 0.5–1.9)
Lactic Acid, Venous: 1.1 mmol/L (ref 0.5–1.9)

## 2024-05-27 LAB — HEMOGLOBIN A1C
Hgb A1c MFr Bld: 6.7 % — ABNORMAL HIGH (ref 4.8–5.6)
Mean Plasma Glucose: 145.59 mg/dL

## 2024-05-27 MED ORDER — ONDANSETRON HCL 4 MG PO TABS: 4.0000 mg | ORAL_TABLET | Freq: Four times a day (QID) | ORAL | Status: DC | PRN

## 2024-05-27 MED ORDER — PIPERACILLIN-TAZOBACTAM 3.375 G IVPB
3.3750 g | Freq: Three times a day (TID) | INTRAVENOUS | Status: DC
  Administered 2024-05-27 – 2024-05-28 (×4): 3.375 g via INTRAVENOUS
  Filled 2024-05-27 (×4): qty 50

## 2024-05-27 MED ORDER — ENOXAPARIN SODIUM 40 MG/0.4ML IJ SOSY
40.0000 mg | PREFILLED_SYRINGE | INTRAMUSCULAR | Status: DC
  Administered 2024-05-27: 40 mg via SUBCUTANEOUS
  Filled 2024-05-27 (×2): qty 0.4

## 2024-05-27 MED ORDER — ATORVASTATIN CALCIUM 20 MG PO TABS
40.0000 mg | ORAL_TABLET | Freq: Every day | ORAL | Status: DC
  Administered 2024-05-27 – 2024-05-28 (×2): 40 mg via ORAL
  Filled 2024-05-27 (×2): qty 2

## 2024-05-27 MED ORDER — BOOST / RESOURCE BREEZE PO LIQD CUSTOM
1.0000 | Freq: Three times a day (TID) | ORAL | Status: DC
  Administered 2024-05-27 – 2024-05-28 (×3): 1 via ORAL

## 2024-05-27 MED ORDER — ONDANSETRON HCL 4 MG/2ML IJ SOLN: 4.0000 mg | Freq: Four times a day (QID) | INTRAMUSCULAR | Status: DC | PRN

## 2024-05-27 MED ORDER — SODIUM CHLORIDE 0.9 % IV SOLN: INTRAVENOUS | Status: DC

## 2024-05-27 MED ORDER — MONTELUKAST SODIUM 10 MG PO TABS
10.0000 mg | ORAL_TABLET | Freq: Every day | ORAL | Status: DC
  Administered 2024-05-27: 10 mg via ORAL
  Filled 2024-05-27: qty 1

## 2024-05-27 MED ORDER — ACETAMINOPHEN 650 MG RE SUPP: 650.0000 mg | Freq: Four times a day (QID) | RECTAL | Status: DC | PRN

## 2024-05-27 MED ORDER — ACETAMINOPHEN 325 MG PO TABS: 650.0000 mg | ORAL_TABLET | Freq: Four times a day (QID) | ORAL | Status: DC | PRN

## 2024-05-27 NOTE — Assessment & Plan Note (Signed)
 Initial lactic acid 2.4 and now normal.  Hold on Glucophage while having diarrhea.

## 2024-05-27 NOTE — Progress Notes (Signed)
 " Progress Note   Patient: Roberto Cole FMW:[id number] DOB: Jan 09, 1956 DOA: 05/26/2024     0 DOS: the patient was seen and examined on 05/27/2024   Brief hospital course: 69 y.o. male with medical history significant of HTN, HLD, Dm type 2, TIA, and OSA who presented to the hospital with complaint of nausea and abdominal pain. The patient states that his symptoms started all of sudden this morning. He had some coughing prior to this the day before with subjective fever but otherwise reports that he has been  well. The patient denied any associated diarrhea but rather reported that he had a normal stool this morning. He also denied any urinary symptoms.    ED Course:  In the ER, BP 115/63, HR 67, RR 23, O2 saturation 97% on RA,  and Tmax 99.6. Cbc demonstrated wbc 17.2,  hb/hct 12.3/36.3, and platelet 213. Chemistry demonstrated Na 137, K 4.9, Cl 100, bicarb 22, Bun/Cr  21/1.87 and glucose 173. Anion gap 15. Initial troponin was 24 and 22. Lactic acid is 2.4.  CXR demonstrated no acute cardiopulmonary findings. CT abdomen/ pelvis demonstrated multiple right renal calculi measuring up to 4 mm, clusterd in central kidney likely obstructing the superior pole calyx. Right renal cyst measuring 2.7 cm without followup. Moderate left and large right inguinal hernias containing nondilated small  bowel.  Urinalysis had 6-10 wbc with rare bacteria,  negative LE and nitrite. EKG normal sinus rhythm t wave inversion in the lateral leads   1/31.  Patient complains of chills nausea and some abdominal pain and weakness.  Currently no abdominal pain.  Felt off balance yesterday.  Assessment and Plan: * Abdominal pain With nausea and 1 episode of diarrhea.  Will send off stool studies if has more diarrhea.  CT scan does not show a cause of his pain.  Chills Patient placed on empiric antibiotics, not sure what I am treating at this point.  Does not meet criteria for sepsis.  Only had leukocytosis and I do not have a  source of infection yet.  On empiric antibiotics.  Repeat chest x-ray tomorrow.  Lactic acidosis Initial lactic acid 2.4 and now normal  Weakness PT evaluation.  Check orthostatics.  Myocardial injury This is demand ischemia.  Troponin only borderline at 22  Essential hypertension Asked pharmacist to get a medication reconciliation.  Type 2 diabetes mellitus with chronic kidney disease, without long-term current use of insulin (HCC) Creatinine 1.72 with a GFR of 43, chronic disease stage IIIa.  Will add on hemoglobin A1c.        Subjective: Patient feels chills nausea weakness and could not walk well.  Had 1 episode of diarrhea.  Physical Exam: Vitals:   05/26/24 1731 05/26/24 2033 05/27/24 0124 05/27/24 0840  BP:  123/74 127/63 122/65  Pulse:  77 66 79  Resp: (!) 24 (!) 26 18 18   Temp:  98.3 F (36.8 C) 98.2 F (36.8 C) 98.2 F (36.8 C)  TempSrc:  Oral    SpO2:  98% 99% 95%  Weight:      Height:       Physical Exam HENT:     Head: Normocephalic.  Eyes:     General: Lids are normal.     Conjunctiva/sclera: Conjunctivae normal.  Cardiovascular:     Rate and Rhythm: Normal rate and regular rhythm.     Heart sounds: Normal heart sounds, S1 normal and S2 normal.  Pulmonary:     Breath sounds: No decreased breath sounds,  wheezing, rhonchi or rales.  Abdominal:     Palpations: Abdomen is soft.     Tenderness: There is no abdominal tenderness.  Musculoskeletal:     Right lower leg: No swelling.     Left lower leg: No swelling.  Skin:    General: Skin is warm.     Findings: No rash.  Neurological:     Mental Status: He is alert and oriented to person, place, and time.     Data Reviewed: 1.72 with GFR 43 lactic acid normal, white blood count 12.5, hemoglobin 11.3, platelet count 174  Family Communication: Wife at bedside  Disposition: Status is: Inpatient Remains inpatient appropriate because: Unclear what I am treating at this point on empiric  antibiotics.  Follow-up cultures.  Will repeat chest x-ray tomorrow morning.  Check orthostatics.  Stool studies if has further diarrhea.  Planned Discharge Destination: Home    Time spent: 28 minutes  Author: Charlie Patterson, MD 05/27/2024 1:33 PM  For on call review [email or web address].  "

## 2024-05-27 NOTE — Assessment & Plan Note (Addendum)
 With nausea and 1 episode of diarrhea.  Will send off stool studies if has more diarrhea.  CT scan does not show a cause of his pain.

## 2024-05-27 NOTE — Evaluation (Signed)
 Physical Therapy Evaluation Patient Details Name: Roberto Cole MRN: [id number] DOB: 1955-05-15 Today's Date: 05/27/2024  History of Present Illness  69 y.o. male with medical history significant of HTN, HLD, Dm type 2, TIA, and OSA who presented to the hospital with complaint of nausea and abdominal pain  Clinical Impression  Pt pleasant and willing to work with PT.  He reports he is still not 100% but feeling much better than on arrival.  Regarding PT/functional mobility concerns pt did very well with no assist needed for bed mobility, sit<>stand transfers and he was able to ambulate ~250 ft w/o AD and negotiate up/down 4 steps with single rail.  Pt with functional strength and ROM in all extremities and reports that his gait is close to his normal.  Pt had no LOBs, SpO2 in the high 90s and HR at 80bpm at end of ambulation.  No further PT needs, will sign off.       If plan is discharge home, recommend the following:     Can travel by private vehicle        Equipment Recommendations None recommended by PT  Recommendations for Other Services       Functional Status Assessment Patient has not had a recent decline in their functional status     Precautions / Restrictions Precautions Precautions:  (mod fall) Restrictions Weight Bearing Restrictions Per Provider Order: No      Mobility  Bed Mobility Overal bed mobility: Independent                  Transfers Overall transfer level: Modified independent Equipment used: Rolling walker (2 wheels)               General transfer comment: easily transitions to standing w/o heavy assist    Ambulation/Gait Ambulation/Gait assistance: Supervision Gait Distance (Feet): 250 Feet Assistive device: None         General Gait Details: first 20 ft with walker, pt showed good confidence and little reliance, remainder of prolonged ambulation effort w/o AD.  Pt with slow but safe and consistent cadence that he reports as  my normal  Stairs Stairs: Yes Stairs assistance: Supervision Stair Management: One rail Left, Alternating pattern Number of Stairs: 4 General stair comments: Pt easily and confidently negotiated up/down 4 steps with single rail  Wheelchair Mobility     Tilt Bed    Modified Rankin (Stroke Patients Only)       Balance Overall balance assessment: Independent, Modified Independent                                           Pertinent Vitals/Pain      Home Living Family/patient expects to be discharged to:: Private residence Living Arrangements: Spouse/significant other Available Help at Discharge: Available 24 hours/day Type of Home: House Home Access: Stairs to enter Entrance Stairs-Rails: Can reach both Entrance Stairs-Number of Steps: 4   Home Layout: One level Home Equipment: None      Prior Function Prior Level of Function : Independent/Modified Independent;Working/employed;Driving             Mobility Comments: architectural technologist, up and down all day ADLs Comments: ind     Extremity/Trunk Assessment   Upper Extremity Assessment Upper Extremity Assessment: Overall WFL for tasks assessed    Lower Extremity Assessment Lower Extremity Assessment: Overall WFL for tasks assessed  Communication   Communication Communication: No apparent difficulties    Cognition Arousal: Alert Behavior During Therapy: WFL for tasks assessed/performed   PT - Cognitive impairments: No apparent impairments                         Following commands: Intact       Cueing       General Comments General comments (skin integrity, edema, etc.): Pt reports feeling much better than on arrival, no functional mobility concerns at this time    Exercises     Assessment/Plan    PT Assessment Patient does not need any further PT services  PT Problem List         PT Treatment Interventions      PT Goals (Current goals can be  found in the Care Plan section)  Acute Rehab PT Goals Patient Stated Goal: go home PT Goal Formulation: All assessment and education complete, DC therapy    Frequency       Co-evaluation               AM-PAC PT 6 Clicks Mobility  Outcome Measure Help needed turning from your back to your side while in a flat bed without using bedrails?: None Help needed moving from lying on your back to sitting on the side of a flat bed without using bedrails?: None Help needed moving to and from a bed to a chair (including a wheelchair)?: None Help needed standing up from a chair using your arms (e.g., wheelchair or bedside chair)?: None Help needed to walk in hospital room?: None Help needed climbing 3-5 steps with a railing? : None 6 Click Score: 24    End of Session   Activity Tolerance: Patient tolerated treatment well Patient left: with bed alarm set;with call bell/phone within reach Nurse Communication: Mobility status PT Visit Diagnosis: Muscle weakness (generalized) (M62.81);Difficulty in walking, not elsewhere classified (R26.2)    Time: 8574-8554 PT Time Calculation (min) (ACUTE ONLY): 20 min   Charges:   PT Evaluation $PT Eval Low Complexity: 1 Low   PT General Charges $$ ACUTE PT VISIT: 1 Visit         Carmin JONELLE Deed, DPT 05/27/2024, 3:30 PM

## 2024-05-27 NOTE — Assessment & Plan Note (Signed)
 Creatinine 1.72 with a GFR of 43, chronic disease stage IIIa.  Will add on hemoglobin A1c.

## 2024-05-27 NOTE — Hospital Course (Signed)
 69 y.o. male with medical history significant of HTN, HLD, Dm type 2, TIA, and OSA who presented to the hospital with complaint of nausea and abdominal pain. The patient states that his symptoms started all of sudden this morning. He had some coughing prior to this the day before with subjective fever but otherwise reports that he has been  well. The patient denied any associated diarrhea but rather reported that he had a normal stool this morning. He also denied any urinary symptoms.    ED Course:  In the ER, BP 115/63, HR 67, RR 23, O2 saturation 97% on RA,  and Tmax 99.6. Cbc demonstrated wbc 17.2,  hb/hct 12.3/36.3, and platelet 213. Chemistry demonstrated Na 137, K 4.9, Cl 100, bicarb 22, Bun/Cr  21/1.87 and glucose 173. Anion gap 15. Initial troponin was 24 and 22. Lactic acid is 2.4.  CXR demonstrated no acute cardiopulmonary findings. CT abdomen/ pelvis demonstrated multiple right renal calculi measuring up to 4 mm, clusterd in central kidney likely obstructing the superior pole calyx. Right renal cyst measuring 2.7 cm without followup. Moderate left and large right inguinal hernias containing nondilated small  bowel.  Urinalysis had 6-10 wbc with rare bacteria,  negative LE and nitrite. EKG normal sinus rhythm t wave inversion in the lateral leads   1/31.  Patient complains of chills nausea and some abdominal pain and weakness.  Currently no abdominal pain.  Felt off balance yesterday.

## 2024-05-27 NOTE — Assessment & Plan Note (Signed)
 Blood pressure on the lower side while here.  We held his blood pressure medication.  Patient advised to take his blood pressure at home if starts rising over 140/90 then can restart his losartan.

## 2024-05-27 NOTE — Plan of Care (Signed)

## 2024-05-27 NOTE — Assessment & Plan Note (Signed)
PT evaluation Check orthostatics

## 2024-05-27 NOTE — Plan of Care (Signed)
°  Problem: Clinical Measurements: °Goal: Ability to maintain clinical measurements within normal limits will improve °Outcome: Progressing °  °Problem: Activity: °Goal: Risk for activity intolerance will decrease °Outcome: Progressing °  °Problem: Coping: °Goal: Level of anxiety will decrease °Outcome: Progressing °  °Problem: Elimination: °Goal: Will not experience complications related to bowel motility °Outcome: Progressing °  °Problem: Safety: °Goal: Ability to remain free from injury will improve °Outcome: Progressing °  °Problem: Skin Integrity: °Goal: Risk for impaired skin integrity will decrease °Outcome: Progressing °  °

## 2024-05-27 NOTE — Assessment & Plan Note (Signed)
 This is demand ischemia.  Troponin only borderline at 22

## 2024-05-27 NOTE — Progress Notes (Signed)
 SPIRITUAL CARE AND COUNSELING CONSULT NOTE   VISIT SUMMARY Chaplain visited patient per a referral from colleague.  Chaplain offered a compassionate presence to patient and his wife and family.    SPIRITUAL ENCOUNTER                                                                                                                                                                      Type of Visit: Initial Care provided to:: Pt and family Referral source: Chaplain team Reason for visit: Routine spiritual support OnCall Visit: Yes   SPIRITUAL FRAMEWORK      GOALS       INTERVENTIONS        INTERVENTION OUTCOMES      SPIRITUAL CARE PLAN        If immediate needs arise, please contact [facility] 24 hour on call [phone].   Hart Moats, Chaplain  05/27/2024 3:52 PM

## 2024-05-27 NOTE — Assessment & Plan Note (Addendum)
 Secondary to your ascending infection

## 2024-05-28 ENCOUNTER — Inpatient Hospital Stay

## 2024-05-28 DIAGNOSIS — E872 Acidosis, unspecified: Secondary | ICD-10-CM | POA: Diagnosis not present

## 2024-05-28 DIAGNOSIS — N1831 Chronic kidney disease, stage 3a: Secondary | ICD-10-CM | POA: Diagnosis not present

## 2024-05-28 DIAGNOSIS — A058 Other specified bacterial foodborne intoxications: Secondary | ICD-10-CM | POA: Diagnosis not present

## 2024-05-28 DIAGNOSIS — R1084 Generalized abdominal pain: Secondary | ICD-10-CM | POA: Diagnosis not present

## 2024-05-28 DIAGNOSIS — I10 Essential (primary) hypertension: Secondary | ICD-10-CM | POA: Diagnosis not present

## 2024-05-28 DIAGNOSIS — E1122 Type 2 diabetes mellitus with diabetic chronic kidney disease: Secondary | ICD-10-CM | POA: Diagnosis not present

## 2024-05-28 DIAGNOSIS — R531 Weakness: Secondary | ICD-10-CM | POA: Diagnosis not present

## 2024-05-28 DIAGNOSIS — I5A Non-ischemic myocardial injury (non-traumatic): Secondary | ICD-10-CM | POA: Diagnosis not present

## 2024-05-28 DIAGNOSIS — R6883 Chills (without fever): Secondary | ICD-10-CM | POA: Diagnosis not present

## 2024-05-28 LAB — GASTROINTESTINAL PANEL BY PCR, STOOL (REPLACES STOOL CULTURE)

## 2024-05-28 LAB — URINALYSIS, W/ REFLEX TO CULTURE (INFECTION SUSPECTED)
Bacteria, UA: NONE SEEN
Bilirubin Urine: NEGATIVE
Glucose, UA: 50 mg/dL — AB
Hgb urine dipstick: NEGATIVE
Ketones, ur: NEGATIVE mg/dL
Leukocytes,Ua: NEGATIVE
Nitrite: NEGATIVE
Protein, ur: NEGATIVE mg/dL
Specific Gravity, Urine: 1.028 (ref 1.005–1.030)
pH: 5 (ref 5.0–8.0)

## 2024-05-28 MED ORDER — CIPROFLOXACIN HCL 500 MG PO TABS: 500.0000 mg | ORAL_TABLET | Freq: Two times a day (BID) | ORAL | 0 refills | Status: AC

## 2024-05-28 MED ORDER — ACETAMINOPHEN 325 MG PO TABS: 650.0000 mg | ORAL_TABLET | Freq: Four times a day (QID) | ORAL | Status: AC | PRN

## 2024-05-28 MED ORDER — CIPROFLOXACIN HCL 500 MG PO TABS
500.0000 mg | ORAL_TABLET | Freq: Two times a day (BID) | ORAL | Status: DC
  Administered 2024-05-28: 500 mg via ORAL
  Filled 2024-05-28: qty 1

## 2024-05-28 NOTE — Progress Notes (Signed)
 Pt's daughter is at the [facility] per the pt; pt discharged via wheelchair by nursing to the [facility] [place] entrance

## 2024-05-28 NOTE — Care Management Obs Status (Signed)
 MEDICARE OBSERVATION STATUS NOTIFICATION   Patient Details  Name: Roberto Cole MRN: [id number] Date of Birth: [birth year]   Medicare Observation Status Notification Given:  No (Placed in chart, explained- CSW off-site.)    Lorraine LILLETTE Fenton, LCSW 05/28/2024, 10:54 AM

## 2024-05-28 NOTE — Care Management CC44 (Signed)
"         Condition Code 44 Documentation Completed  Patient Details  Name: JAMISEN DUERSON MRN: [id number] Date of Birth: 13-Mar-1956   Condition Code 44 given:  Yes Patient signature on Condition Code 44 notice:  No  Documentation of 2 MD's agreement:  Yes  Code 44 added to claim:  Yes     Lorraine LILLETTE Fenton, LCSW 05/28/2024, 10:55 AM  "

## 2024-05-28 NOTE — Progress Notes (Signed)
 MD order received in [facility] to discharge pt home today; verbally reviewed AVS with pt; no questions voiced at this time; pt's discharge pending arrival of his son at the [facility] for discharge

## 2024-05-28 NOTE — Plan of Care (Signed)

## 2024-05-28 NOTE — Discharge Summary (Signed)
 " Physician Discharge Summary   Patient: Roberto Cole MRN: [id number] DOB: 1956-02-09  Admit date:     05/26/2024  Discharge date: 05/28/24  Discharge Physician: Charlie Patterson   PCP: Gregory Elsie HERO, MD (Inactive)   Recommendations at discharge:   Follow-up with your medical doctor 5 days Check your blood pressure at home if starts rising over 140/90 can restart 1 blood pressure medication at a time.  Discharge Diagnoses: Principal Problem:   Yersinia enterocolitica food poisoning Active Problems:   Abdominal pain   Chills   Lactic acidosis   Weakness   Type 2 diabetes mellitus with chronic kidney disease, without long-term current use of insulin (HCC)   Essential hypertension   Myocardial injury  Resolved Problems:   * No resolved hospital problems. *  Hospital Course: 69 y.o. male with medical history significant of HTN, HLD, Dm type 2, TIA, and OSA who presented to the hospital with complaint of nausea and abdominal pain. The patient states that his symptoms started all of sudden this morning. He had some coughing prior to this the day before with subjective fever but otherwise reports that he has been  well. The patient denied any associated diarrhea but rather reported that he had a normal stool this morning. He also denied any urinary symptoms.    ED Course:  In the ER, BP 115/63, HR 67, RR 23, O2 saturation 97% on RA,  and Tmax 99.6. Cbc demonstrated wbc 17.2,  hb/hct 12.3/36.3, and platelet 213. Chemistry demonstrated Na 137, K 4.9, Cl 100, bicarb 22, Bun/Cr  21/1.87 and glucose 173. Anion gap 15. Initial troponin was 24 and 22. Lactic acid is 2.4.  CXR demonstrated no acute cardiopulmonary findings. CT abdomen/ pelvis demonstrated multiple right renal calculi measuring up to 4 mm, clusterd in central kidney likely obstructing the superior pole calyx. Right renal cyst measuring 2.7 cm without followup. Moderate left and large right inguinal hernias containing nondilated  small  bowel.  Urinalysis had 6-10 wbc with rare bacteria,  negative LE and nitrite. EKG normal sinus rhythm t wave inversion in the lateral leads   1/31.  Patient complains of chills nausea and some abdominal pain and weakness.  Currently no abdominal pain.  Felt off balance yesterday.  Assessment and Plan: * Yersinia enterocolitica food poisoning Switch antibiotic over to Cipro  for 5 days.  Patient feeling better today.  Abdominal pain Secondary to the Yersinia infection  Chills Secondary to your ascending infection  Lactic acidosis Initial lactic acid 2.4 and now normal.  Hold on Glucophage while having diarrhea.  Weakness Patient not orthostatic.  Did well with physical therapy.  Myocardial injury This is demand ischemia.  Troponin only borderline at 22  Essential hypertension Blood pressure on the lower side while here.  We held his blood pressure medication.  Patient advised to take his blood pressure at home if starts rising over 140/90 then can restart his losartan.  Type 2 diabetes mellitus with chronic kidney disease, without long-term current use of insulin (HCC) Creatinine 1.72 with a GFR of 43, chronic disease stage IIIa.  Hemoglobin A1c 6.7         Consultants: None Procedures performed: None Disposition: Home Diet recommendation:  Cardiac and carb modified diet DISCHARGE MEDICATION: Allergies as of 05/28/2024   No Known Allergies      Medication List     STOP taking these medications    amLODipine 5 MG tablet Commonly known as: NORVASC   carvedilol 25 MG tablet  Commonly known as: COREG   losartan 100 MG tablet Commonly known as: COZAAR   metFORMIN 1000 MG tablet Commonly known as: GLUCOPHAGE       TAKE these medications    acetaminophen  325 MG tablet Commonly known as: TYLENOL  Take 2 tablets (650 mg total) by mouth every 6 (six) hours as needed for mild pain (pain score 1-3) or fever (or Fever >/= 101).   atorvastatin  40 MG  tablet Commonly known as: LIPITOR Take 40 mg by mouth daily.   ciprofloxacin  500 MG tablet Commonly known as: CIPRO  Take 1 tablet (500 mg total) by mouth 2 (two) times daily for 9 doses.   montelukast  10 MG tablet Commonly known as: SINGULAIR  Take 10 mg by mouth at bedtime.        Follow-up Information     Gregory Elsie HERO, MD Follow up in 5 day(s).   Specialty: Family Medicine Contact information: [address] [place] [address] [phone]                Discharge Exam: [name]   05/26/24 1722  Weight: 81.6 kg   Physical Exam HENT:     Head: Normocephalic.  Eyes:     General: Lids are normal.     Conjunctiva/sclera: Conjunctivae normal.  Cardiovascular:     Rate and Rhythm: Normal rate and regular rhythm.     Heart sounds: Normal heart sounds, S1 normal and S2 normal.  Pulmonary:     Breath sounds: No decreased breath sounds, wheezing, rhonchi or rales.  Abdominal:     Palpations: Abdomen is soft.     Tenderness: There is no abdominal tenderness.  Musculoskeletal:     Right lower leg: No swelling.     Left lower leg: No swelling.  Skin:    General: Skin is warm.     Findings: No rash.  Neurological:     Mental Status: He is alert and oriented to person, place, and time.      Condition at discharge: stable  The results of significant diagnostics from this hospitalization (including imaging, microbiology, ancillary and laboratory) are listed below for reference.   Imaging Studies: DG Chest Port 1 View Result Date: 05/28/2024 EXAM: 1 VIEW(S) XRAY OF THE CHEST 05/28/2024 06:13:00 AM COMPARISON: 05/26/2024 CLINICAL HISTORY: Cough. FINDINGS: LUNGS AND PLEURA: There is a new left upper lobe perihilar airspace opacity. The most likely differential diagnosis includes pneumonia. Appropriate follow-up guidelines include clinical correlation and consideration of repeat imaging. No pleural effusion. No pneumothorax. HEART AND MEDIASTINUM: No acute  abnormality of the cardiac and mediastinal silhouettes. BONES AND SOFT TISSUES: No acute osseous abnormality. IMPRESSION: 1. New left upper lobe perihilar airspace opacity, most consistent with pneumonia in the setting of cough. 2. Recommend follow-up chest radiograph in 6-8 weeks to document resolution; if persistent or progressive, further evaluation with chest CT is recommended. Electronically signed by: Waddell Calk MD 05/28/2024 07:05 AM EST RP Workstation: [id number]   CT ABDOMEN PELVIS W CONTRAST Result Date: 05/26/2024 EXAM: CT ABDOMEN AND PELVIS WITH CONTRAST 05/26/2024 09:24:25 PM TECHNIQUE: CT of the abdomen and pelvis was performed with the administration of 75 mL of iohexol  (OMNIPAQUE ) 350 MG/ML injection. Multiplanar reformatted images are provided for review. Automated exposure control, iterative reconstruction, and/or weight-based adjustment of the mA/kV was utilized to reduce the radiation dose to as low as reasonably achievable. COMPARISON: None available. CLINICAL HISTORY: Sepsis, nausea, lower abdominal tenderness. FINDINGS: LOWER CHEST: There is atelectasis in the lung bases. LIVER: The  liver is unremarkable. GALLBLADDER AND BILE DUCTS: Gallbladder is unremarkable. No biliary ductal dilatation. SPLEEN: No acute abnormality. PANCREAS: No acute abnormality. ADRENAL GLANDS: No acute abnormality. KIDNEYS, URETERS AND BLADDER: Right kidney: Multiple right renal calculi are present, the majority of which are clustered together in the central kidney. These all measure 4 mm. These likely cause obstruction of the superior pole calyx. There is a cyst in the central pole of the right kidney measuring 2.7 cm. Per consensus, no follow-up is needed for simple Bosniak type 1 and 2 renal cysts, unless the patient has a malignancy history or risk factors. Left kidney: Left kidney appears normal. General: No hydronephrosis. No perinephric or periureteral stranding. Bladder: Urinary bladder is unremarkable.  Prostate: Prostate gland is mildly enlarged. GI AND BOWEL: There is a small hiatal hernia. Sigmoid and descending colon diverticulosis. The appendix appears normal. There is no bowel obstruction. PERITONEUM AND RETROPERITONEUM: There is a small amount of free fluid in the pelvis. No free air. VASCULATURE: Aorta is normal in caliber. LYMPH NODES: No lymphadenopathy. REPRODUCTIVE ORGANS: Prostate gland is mildly enlarged. BONES AND SOFT TISSUES: Moderate left and large right inguinal hernias are present containing nondilated small bowel. There is a small fat containing umbilical hernia. No acute osseous abnormality. IMPRESSION: 1. Multiple right renal calculi measuring up to 4 mm, clustered in the central kidney, likely obstructing the superior pole calyx. 2. Right renal cyst measuring 2.7 cm, without follow-up imaging recommended. 3. Moderate left and large right inguinal hernias containing nondilated small bowel. 4. Sigmoid and descending colon diverticulosis without diverticulitis. 5. Small volume free fluid in the pelvis. Electronically signed by: Greig Pique MD 05/26/2024 10:51 PM EST RP Workstation: [id number]   DG Chest Portable 1 View Result Date: 05/26/2024 CLINICAL DATA:  Sepsis, nausea, weakness EXAM: PORTABLE CHEST 1 VIEW COMPARISON:  09/03/2017 FINDINGS: Single frontal view of the chest demonstrates an unremarkable cardiac silhouette. No acute airspace disease, effusion, or pneumothorax. No acute bony abnormalities. IMPRESSION: 1. No acute intrathoracic process. Electronically Signed   By: Ozell Daring M.D.   On: 05/26/2024 22:15    Microbiology: Results for orders placed or performed during the hospital encounter of 05/26/24  Resp panel by RT-PCR (RSV, Flu A&B, Covid) Anterior Nasal Swab     Status: None   Collection Time: 05/26/24  8:27 PM   Specimen: Anterior Nasal Swab  Result Value Ref Range Status   SARS Coronavirus 2 by RT PCR NEGATIVE NEGATIVE Final    Comment: (NOTE) SARS-CoV-2  target nucleic acids are NOT DETECTED.  The SARS-CoV-2 RNA is generally detectable in upper respiratory specimens during the acute phase of infection. The lowest concentration of SARS-CoV-2 viral copies this assay can detect is 138 copies/mL. A negative result does not preclude SARS-Cov-2 infection and should not be used as the sole basis for treatment or other patient management decisions. A negative result may occur with  improper specimen collection/handling, submission of specimen other than nasopharyngeal swab, presence of viral mutation(s) within the areas targeted by this assay, and inadequate number of viral copies(<138 copies/mL). A negative result must be combined with clinical observations, patient history, and epidemiological information. The expected result is Negative.  Fact Sheet for Patients:  bloggercourse.com  Fact Sheet for Healthcare Providers:  seriousbroker.it  This test is no t yet approved or cleared by the [place]  FDA and  has been authorized for detection and/or diagnosis of SARS-CoV-2 by FDA under an Emergency Use Authorization (EUA). This EUA will remain  in effect (  meaning this test can be used) for the duration of the COVID-19 declaration under Section 564(b)(1) of the Act, 21 U.S.C.section 360bbb-3(b)(1), unless the authorization is terminated  or revoked sooner.       Influenza A by PCR NEGATIVE NEGATIVE Final   Influenza B by PCR NEGATIVE NEGATIVE Final    Comment: (NOTE) The Xpert Xpress SARS-CoV-2/FLU/RSV plus assay is intended as an aid in the diagnosis of influenza from Nasopharyngeal swab specimens and should not be used as a sole basis for treatment. Nasal washings and aspirates are unacceptable for Xpert Xpress SARS-CoV-2/FLU/RSV testing.  Fact Sheet for Patients: bloggercourse.com  Fact Sheet for Healthcare  Providers: seriousbroker.it  This test is not yet approved or cleared by the [place]  FDA and has been authorized for detection and/or diagnosis of SARS-CoV-2 by FDA under an Emergency Use Authorization (EUA). This EUA will remain in effect (meaning this test can be used) for the duration of the COVID-19 declaration under Section 564(b)(1) of the Act, 21 U.S.C. section 360bbb-3(b)(1), unless the authorization is terminated or revoked.     Resp Syncytial Virus by PCR NEGATIVE NEGATIVE Final    Comment: (NOTE) Fact Sheet for Patients: bloggercourse.com  Fact Sheet for Healthcare Providers: seriousbroker.it  This test is not yet approved or cleared by the [place]  FDA and has been authorized for detection and/or diagnosis of SARS-CoV-2 by FDA under an Emergency Use Authorization (EUA). This EUA will remain in effect (meaning this test can be used) for the duration of the COVID-19 declaration under Section 564(b)(1) of the Act, 21 U.S.C. section 360bbb-3(b)(1), unless the authorization is terminated or revoked.  Performed at [facility], [address] [place], [place] [address]   Blood culture (routine x 2)     Status: None (Preliminary result)   Collection Time: 05/26/24  8:55 PM   Specimen: BLOOD  Result Value Ref Range Status   Specimen Description BLOOD BLOOD LEFT ARM  Final   Special Requests   Final    BOTTLES DRAWN AEROBIC AND ANAEROBIC Blood Culture adequate volume   Culture   Final    NO GROWTH 2 DAYS Performed at [facility], [address] [place], [place] [address]    Report Status PENDING  Incomplete  Blood culture (routine x 2)     Status: None (Preliminary result)   Collection Time: 05/26/24  8:55 PM   Specimen: BLOOD  Result Value Ref Range Status   Specimen Description BLOOD BLOOD LEFT ARM  Final   Special Requests   Final    BOTTLES DRAWN AEROBIC  AND ANAEROBIC Blood Culture adequate volume   Culture   Final    NO GROWTH 2 DAYS Performed at [facility], [address] [place], [place] [address]    Report Status PENDING  Incomplete  Gastrointestinal Panel by PCR , Stool     Status: Abnormal   Collection Time: 05/28/24  7:00 AM   Specimen: Stool  Result Value Ref Range Status   Campylobacter species NOT DETECTED NOT DETECTED Final   Plesimonas shigelloides NOT DETECTED NOT DETECTED Final   Salmonella species NOT DETECTED NOT DETECTED Final   Yersinia enterocolitica DETECTED (A) NOT DETECTED Final    Comment: RESULT CALLED TO, READ BACK BY AND VERIFIED WITH: ROBIN THOMAS ON 05/28/24 AT 0934 QSD    Vibrio species NOT DETECTED NOT DETECTED Final   Vibrio cholerae NOT DETECTED NOT DETECTED Final   Enteroaggregative E coli (EAEC) NOT DETECTED NOT DETECTED Final  Enteropathogenic E coli (EPEC) NOT DETECTED NOT DETECTED Final   Enterotoxigenic E coli (ETEC) NOT DETECTED NOT DETECTED Final   Shiga like toxin producing E coli (STEC) NOT DETECTED NOT DETECTED Final   Shigella/Enteroinvasive E coli (EIEC) NOT DETECTED NOT DETECTED Final   Cryptosporidium NOT DETECTED NOT DETECTED Final   Cyclospora cayetanensis NOT DETECTED NOT DETECTED Final   Entamoeba histolytica NOT DETECTED NOT DETECTED Final   Giardia lamblia NOT DETECTED NOT DETECTED Final   Adenovirus F40/41 NOT DETECTED NOT DETECTED Final   Astrovirus NOT DETECTED NOT DETECTED Final   Norovirus GI/GII NOT DETECTED NOT DETECTED Final   Rotavirus A NOT DETECTED NOT DETECTED Final   Sapovirus (I, II, IV, and V) NOT DETECTED NOT DETECTED Final    Comment: Performed at [facility], [address] [place], [place] [address]    Labs: CBC: Recent Labs  Lab 05/26/24 1638 05/27/24 0350  WBC 17.2* 12.5*  HGB 12.3* 11.3*  HCT 36.3* 33.3*  MCV 84.4 83.9  PLT 213 174   Basic Metabolic Panel: Recent Labs  Lab 05/26/24 1638 05/27/24 0350  NA 137 139  K  4.9 4.1  CL 100 102  CO2 22 24  GLUCOSE 173* 118*  BUN 21 23  CREATININE 1.87* 1.72*  CALCIUM  9.3 8.5*   Liver Function Tests: Recent Labs  Lab 05/26/24 1638  AST 16  ALT 11  ALKPHOS 129*  BILITOT 1.7*  PROT 7.1  ALBUMIN 4.2   CBG: Recent Labs  Lab 05/26/24 1740  GLUCAP 153*    Discharge time spent: greater than 30 minutes.  Signed: Charlie Patterson, MD [facility] 05/28/2024 "

## 2024-05-28 NOTE — Assessment & Plan Note (Signed)
 Switch antibiotic over to Cipro  for 5 days.  Patient feeling better today.

## 2024-05-31 LAB — CULTURE, BLOOD (ROUTINE X 2)
Culture: NO GROWTH
Culture: NO GROWTH
Special Requests: ADEQUATE
Special Requests: ADEQUATE
